# Patient Record
Sex: Female | Born: 2000 | Race: White | Hispanic: No | Marital: Single
Health system: Southern US, Community
[De-identification: ages and names within clinical notes are randomized; demographics above are authoritative.]

## PROBLEM LIST (undated history)

## (undated) DIAGNOSIS — R440 Auditory hallucinations: Secondary | ICD-10-CM

## (undated) DIAGNOSIS — F32A Depression, unspecified: Secondary | ICD-10-CM

## (undated) DIAGNOSIS — Z7289 Other problems related to lifestyle: Secondary | ICD-10-CM

## (undated) DIAGNOSIS — F909 Attention-deficit hyperactivity disorder, unspecified type: Secondary | ICD-10-CM

## (undated) DIAGNOSIS — F419 Anxiety disorder, unspecified: Secondary | ICD-10-CM

## (undated) DIAGNOSIS — F329 Major depressive disorder, single episode, unspecified: Secondary | ICD-10-CM

## (undated) DIAGNOSIS — R45851 Suicidal ideations: Secondary | ICD-10-CM

## (undated) HISTORY — PX: TONSILLECTOMY: SUR1361

---
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
## 2021-01-23 ENCOUNTER — Encounter (HOSPITAL_COMMUNITY): Payer: Self-pay | Admitting: Physician Assistant

## 2021-03-01 NOTE — Progress Notes (Deleted)
   NEUROLOGY FOLLOW UP OFFICE NOTE  Cindy [name] [id number]  Assessment/Plan:   Idiopathic intracranial hypertension  ***  Subjective:  Cindy [name] is an 20 year old right-handed trans female with PTSD, depression, anxiety, ADHD and history of psychosis who follows up for idiopathic intracranial hypertension.     Reports history of occasional headaches.  On routine ophthalmology exam, he was found to have left greater than right papilledema in October 2021.  MRI of brain and orbits with and without contrast on 01/06/2020  personally reviewed showed partially empty sella but otherwise unremarkable.  Underwent lumbar puncture which demonstrated opening pressure of 32 cc water and closing pressure of 10 cc water.  He was started on acetazolamide 500mg  twice daily.  Headaches infrequent.  No visual obscurations or pulsatile tinnitus.  Patient has lost 20 lbs since diagnosis.    PAST MEDICAL HISTORY: Past Medical History:  Diagnosis Date   ADHD    Anxiety    Auditory hallucinations    Deliberate self-cutting    Depression    Suicidal ideation     MEDICATIONS: Current Outpatient Medications on File Prior to Visit  Medication Sig Dispense Refill   acetaZOLAMIDE (DIAMOX) 250 MG tablet Take 2 tablets (500 mg total) by mouth 2 (two) times daily. 120 tablet 5   ARIPiprazole (ABILIFY) 15 MG tablet Take 1 tablet (15 mg total) by mouth at bedtime. 30 tablet 1   buPROPion (WELLBUTRIN XL) 300 MG 24 hr tablet Take 1 tablet (300 mg total) by mouth in the morning. 30 tablet 1   busPIRone (BUSPAR) 10 MG tablet Take 10 mg by mouth 3 (three) times daily.     omeprazole (PRILOSEC) 20 MG capsule Take 20 mg by mouth daily before breakfast.     No current facility-administered medications on file prior to visit.    ALLERGIES: Allergies  Allergen Reactions   Eucalyptus Oil Shortness Of Breath and Swelling   Pollen Extract Itching   Adhesive [Tape] Itching    FAMILY HISTORY: Family  History  Problem Relation Age of Onset   [place]ig[place]ines Mother    Migraines Brother    Cancer Paternal Grandmother       Objective:  *** General: No acute distress.  Patient appears ***-groomed.   Head:  Normocephalic/atraumatic Eyes:  Fundi examined but not visualized Neck: supple, no paraspinal tenderness, full range of motion Heart:  Regular rate and rhythm Lungs:  Clear to auscultation bilaterally Back: No paraspinal tenderness Neurological Exam: alert and oriented to person, place, and time.  Speech fluent and not dysarthric, language intact.  CN II-XII intact. Bulk and tone normal, muscle strength 5/5 throughout.  Sensation to light touch intact.  Deep tendon reflexes 2+ throughout, toes downgoing.  Finger to nose testing intact.  Gait normal, Romberg negative.   , DO  CC: ***

## 2021-03-02 ENCOUNTER — Ambulatory Visit: Payer: Self-pay | Admitting: Neurology

## 2021-10-19 IMAGING — MR MR ORBITS WO/W CM
8 of 21 series · 20 of 48 positions shown · IV contrast (gadavist)
Comparison: None.

CLINICAL DATA: Initial evaluation for acute by not ocular visual
loss, optic disc edema.

EXAM:
MRI HEAD AND ORBITS WITHOUT AND WITH CONTRAST
TECHNIQUE: Multiplanar, multiecho pulse sequences of the brain and surrounding
structures were obtained without and with intravenous contrast.
Multiplanar, multiecho pulse sequences of the orbits and surrounding
structures were obtained including fat saturation techniques, before
and after intravenous contrast administration.
CONTRAST:  10mL GADAVIST GADOBUTROL 1 MMOL/ML IV SOLN

[Series 2: T2 · axial · 5.0mm · 0.23mm/px · 1 of 26 slices shown]
[im 1/26]
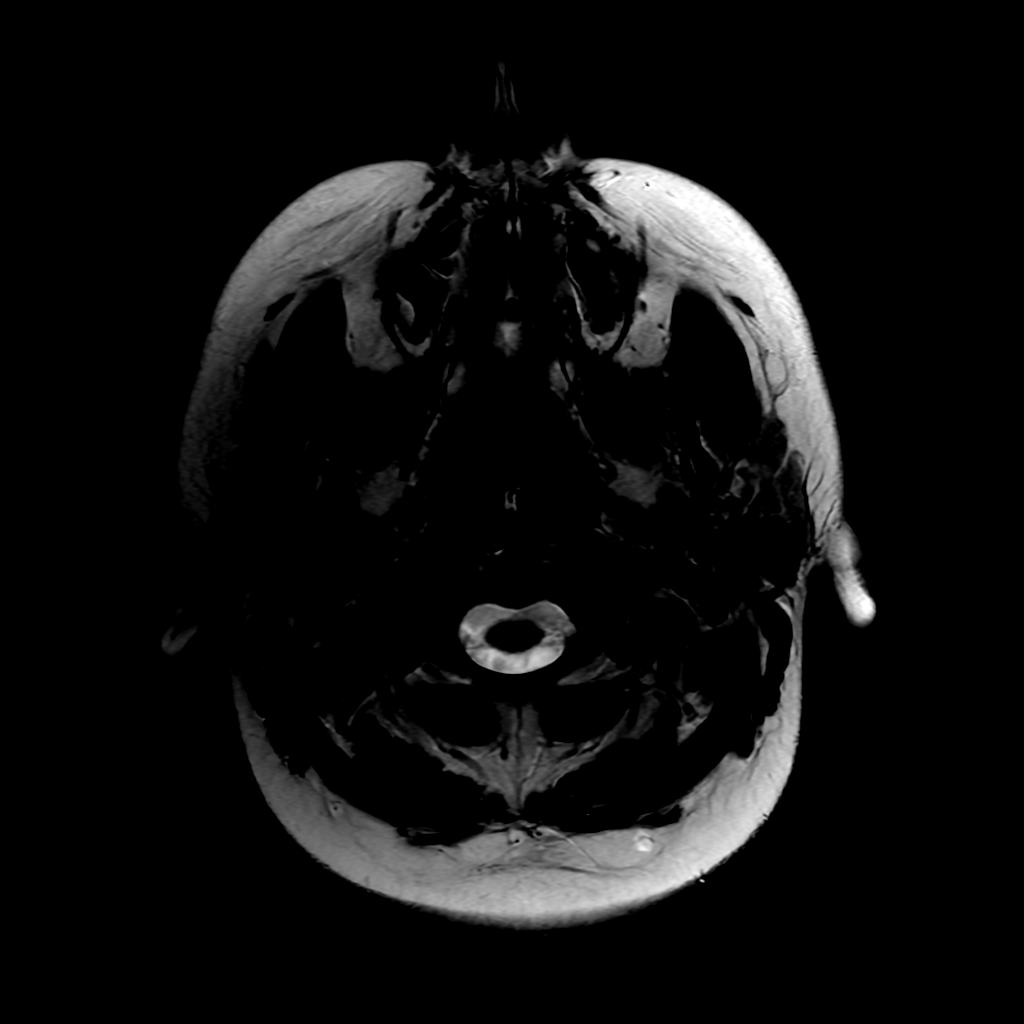

[Series 4: DWI · axial · 3.0mm · 0.94mm/px · z∈[-80,+72]mm · 6 of 104 slices shown (1 of 2)]
[im 1/104]
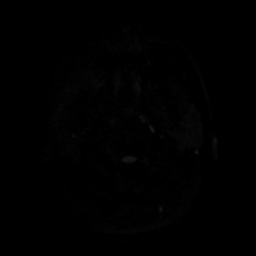
[im 21/104]
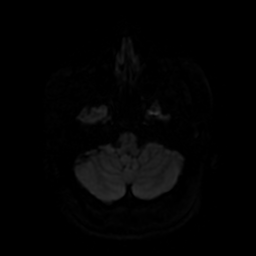
[im 42/104]
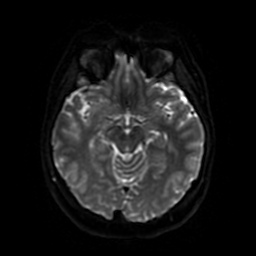
[im 62/104]
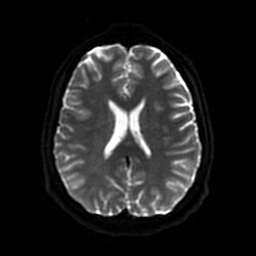
[im 83/104]
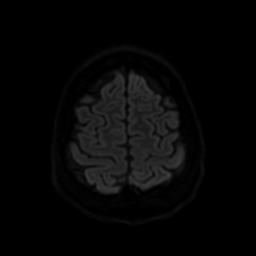
[im 104/104]
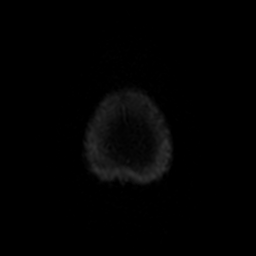

[Series 5: DWI · coronal · 4.0mm · 0.94mm/px · 4 of 78 slices shown (2 of 2)]
[im 1/78]
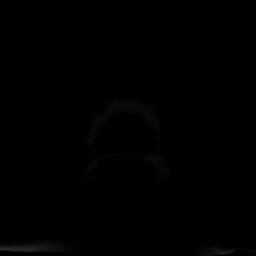
[im 26/78]
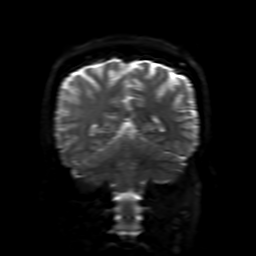
[im 52/78]
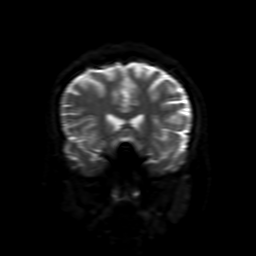
[im 78/78]
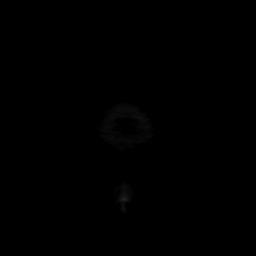

[Series 6: T2 fat-sat · coronal · 4.0mm · 0.35mm/px · 1 of 14 slices shown]
[im 1/14]
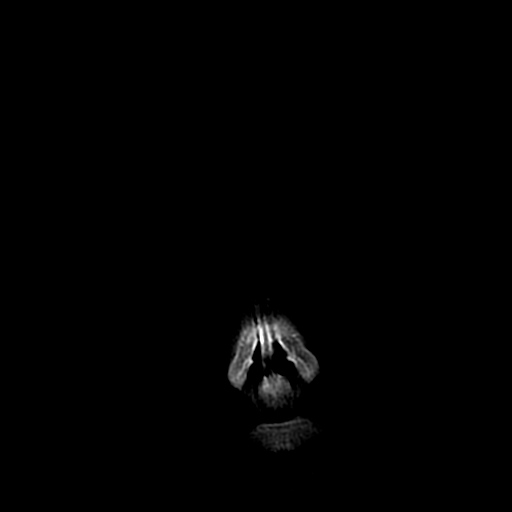

[Series 10: FLAIR · axial · 3.0mm · 0.45mm/px · 1 of 26 slices shown (1 of 2)]
[im 1/26]
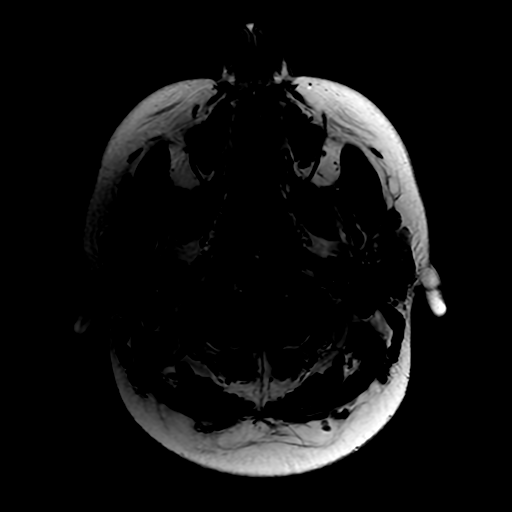

[Series 11: FLAIR · sagittal · 5.0mm · 0.23mm/px · 2 of 27 slices shown (2 of 2)]
[im 1/27]
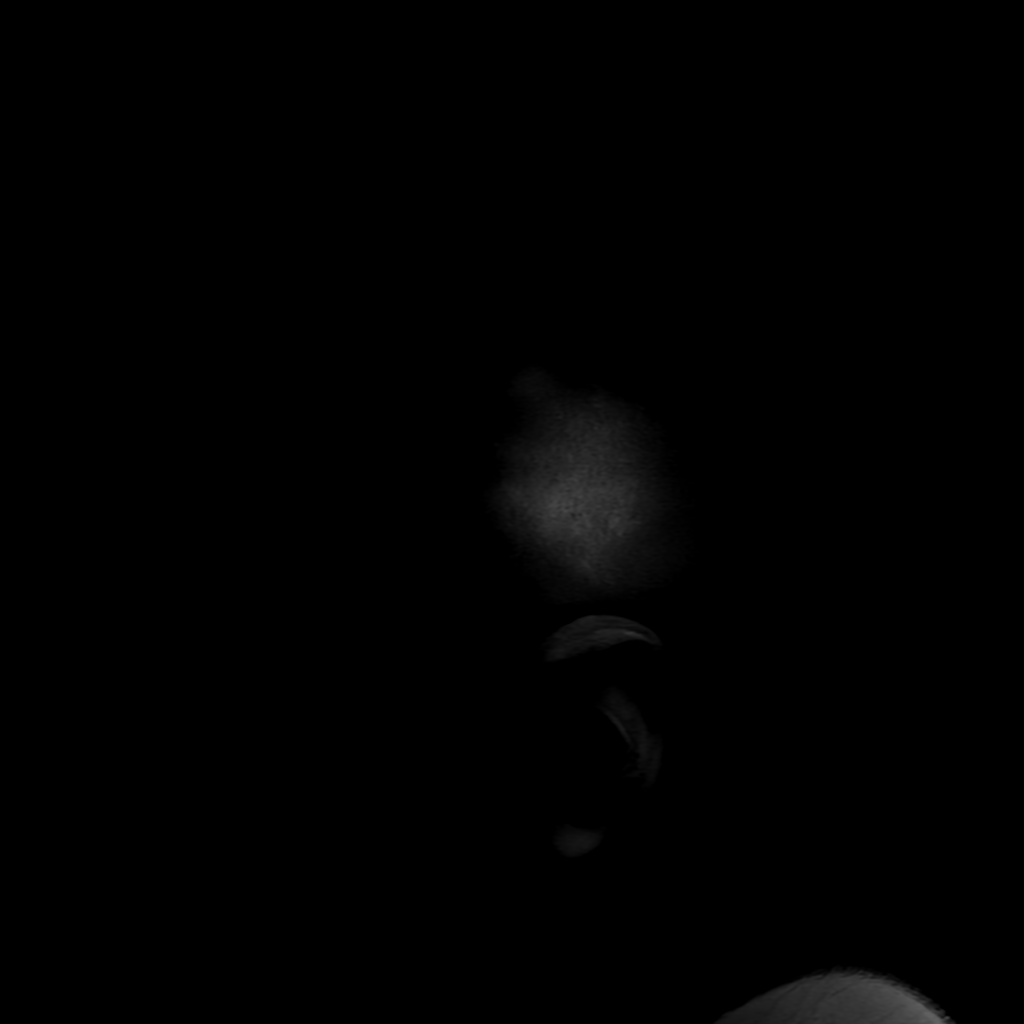
[im 27/27]
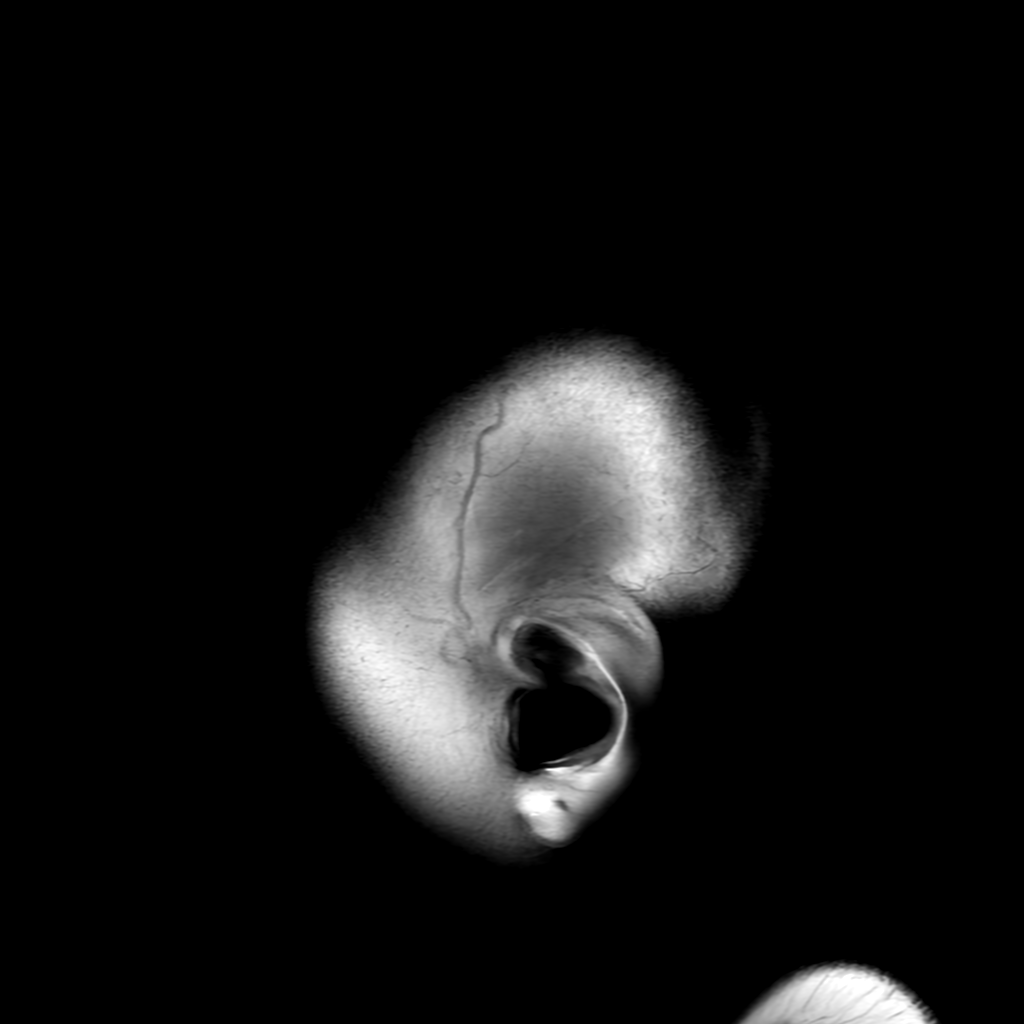

[Series 450: ADC · axial · 3.0mm · 0.94mm/px · z∈[-80,+72]mm · 3 of 52 slices shown (1 of 2)]
[im 1/52]
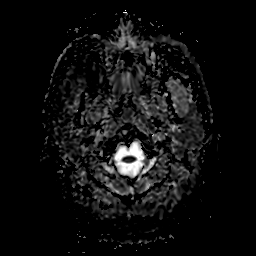
[im 26/52]
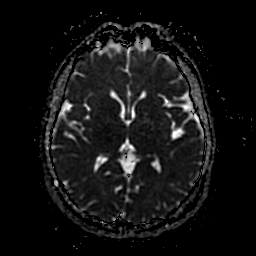
[im 52/52]
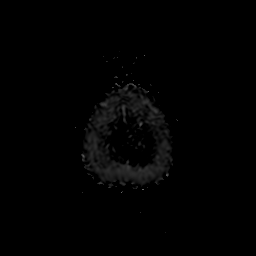

[Series 550: ADC · coronal · 4.0mm · 0.94mm/px · 2 of 39 slices shown (2 of 2)]
[im 1/39]
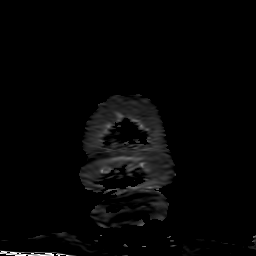
[im 39/39]
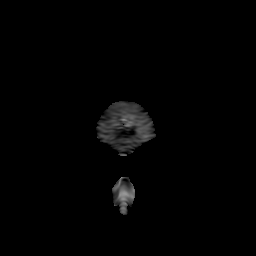

[20 of 48 positions shown; findings below may reference images not displayed]

FINDINGS: MRI HEAD FINDINGS

Brain: Cerebral volume within normal limits. No focal parenchymal
signal abnormality. No abnormal foci of restricted diffusion to
suggest acute or subacute ischemia. Gray-white matter
differentiation maintained. No encephalomalacia to suggest chronic
cortical infarction. No foci of susceptibility artifact to suggest
acute or chronic intracranial hemorrhage.

No mass lesion, midline shift or mass effect. Ventricles normal size
without hydrocephalus. No extra-axial fluid collection.

Partially empty sella with flattening of the pituitary gland, most
pronounced on the right. Midline structures intact.

No abnormal enhancement.

Vascular: Major intracranial vascular flow voids are well
maintained. No secondary findings to suggest dural sinus thrombosis.
Probable focal stenoses/effacement at the distal transverse/sigmoid
sinus junctions bilaterally noted.

Skull and upper cervical spine: Craniocervical junction within
normal limits. Bone marrow signal intensity normal. No scalp soft
tissue abnormality.

Other: No mastoid effusion.  Inner ear structures grossly normal.

MRI ORBITS FINDINGS

Orbits: Mild flattening at the posterior aspects of the globes
bilaterally, slightly more prominent on the left. Subtle bulging of
the optic discs noted as well, also slightly more prominent on the
left. Optic nerves are somewhat tortuous with increased CSF signal
intensity along the optic nerve sheaths. No intrinsic optic nerve
edema or enhancement to suggest acute optic neuritis. No abnormality
about the orbital apices. Optic chiasm normally situated within the
suprasellar cistern. No sellar or suprasellar mass. Visualized optic
radiations within normal limits.

Extra-ocular muscles symmetric and normal. Intraconal and extraconal
fat well-maintained. Lacrimal glands normal. Superior orbital veins
symmetric and normal. No abnormality about the cavernous sinus.

Visualized sinuses: Mild mucosal thickening noted within the
maxillary sinuses bilaterally. Paranasal sinuses are otherwise
largely clear.

Soft tissues: Unremarkable.
IMPRESSION: 1. Partially empty sella with orbital findings as above, which can
be seen in the setting of idiopathic intracranial hypertension in
the correct clinical setting. Correlation with lumbar puncture with
opening pressure is suggested as clinically warranted.
2. Otherwise normal MRI of the brain and orbits. No other acute
intracranial abnormality. No mass lesion or evidence for optic
neuritis.
